# Patient Record
Sex: Male | Born: 1985 | Race: White | Hispanic: No | Marital: Married | Smoking: Current every day smoker
Health system: Southern US, Community
[De-identification: ages and names within clinical notes are randomized; demographics above are authoritative.]

## PROBLEM LIST (undated history)

## (undated) DIAGNOSIS — F32A Depression, unspecified: Secondary | ICD-10-CM

## (undated) DIAGNOSIS — F329 Major depressive disorder, single episode, unspecified: Secondary | ICD-10-CM

---
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
## 2007-09-23 ENCOUNTER — Emergency Department (HOSPITAL_COMMUNITY): Admission: EM | Admit: 2007-09-23 | Discharge: 2007-09-23 | Payer: Self-pay | Admitting: Emergency Medicine

## 2007-10-16 ENCOUNTER — Emergency Department (HOSPITAL_COMMUNITY): Admission: EM | Admit: 2007-10-16 | Discharge: 2007-10-17 | Payer: Self-pay | Admitting: Emergency Medicine

## 2008-05-13 ENCOUNTER — Emergency Department (HOSPITAL_COMMUNITY): Admission: EM | Admit: 2008-05-13 | Discharge: 2008-05-13 | Payer: Self-pay | Admitting: Emergency Medicine

## 2009-09-15 IMAGING — CT CT PELVIS W/O CM
2 of 4 series · 14 of 32 positions shown, 19 images · non-contrast
Comparison: None available

CT ABDOMEN

CLINICAL DATA: Left flank pain times 2-3 days.  History of renal
stones

CT ABDOMEN AND PELVIS WITHOUT CONTRAST
TECHNIQUE: Multidetector CT imaging of the abdomen and pelvis was
performed following the standard
protocol without intravenous contrast.

[Series 2: renal stone · axial · 0.70mm/px · z∈[-388,-58]mm · 7 of 90 slices shown, 12 images]
[im 12/90  soft-tissue]
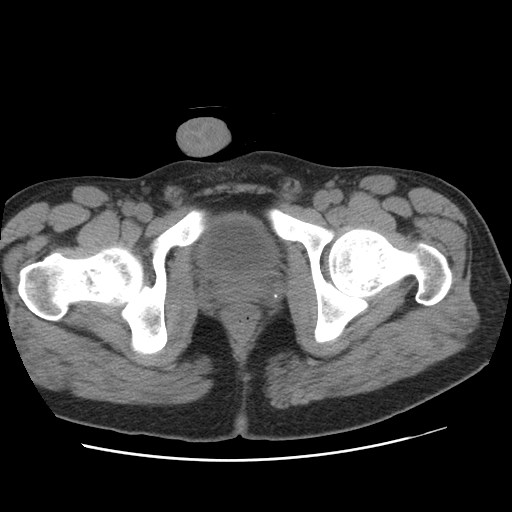
[im 12/90  bone]
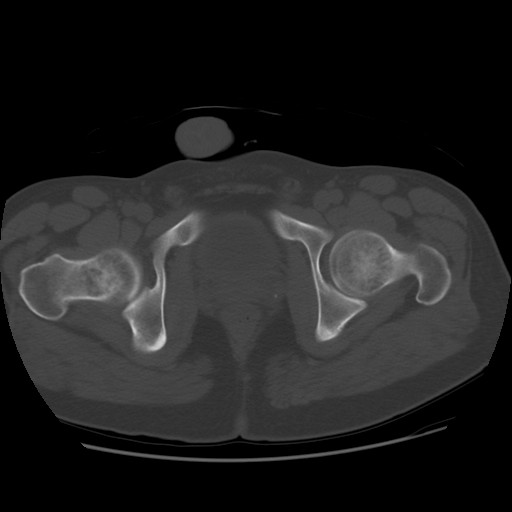
[im 23/90  soft-tissue]
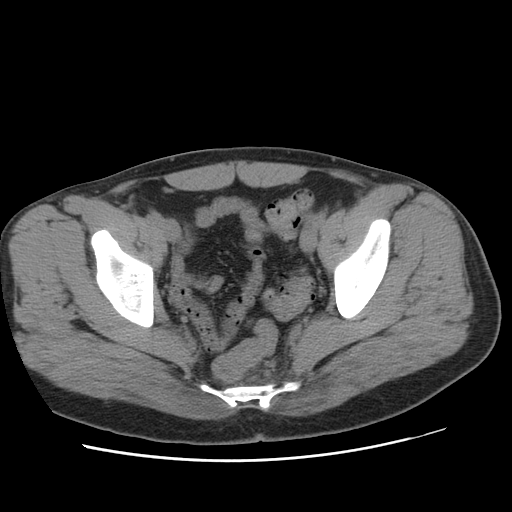
[im 34/90  soft-tissue]
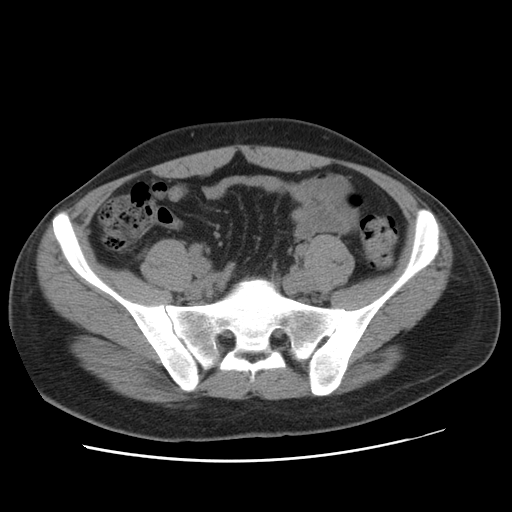
[im 45/90  soft-tissue]
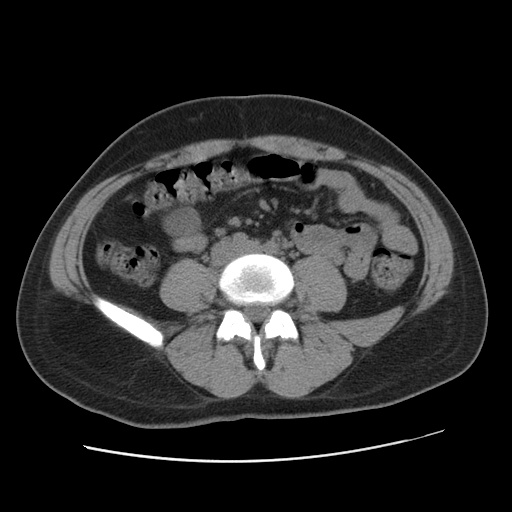
[im 45/90  lung]
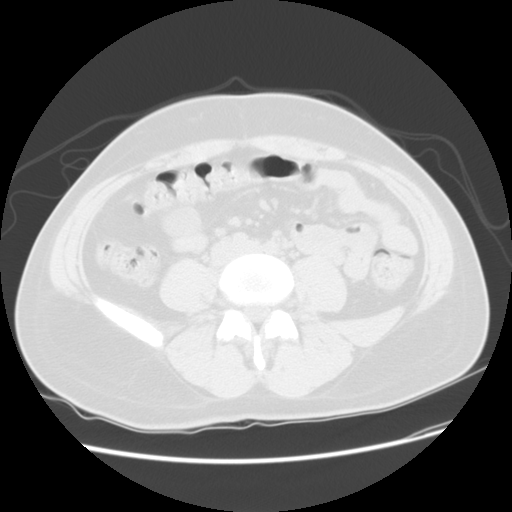
[im 56/90  soft-tissue]
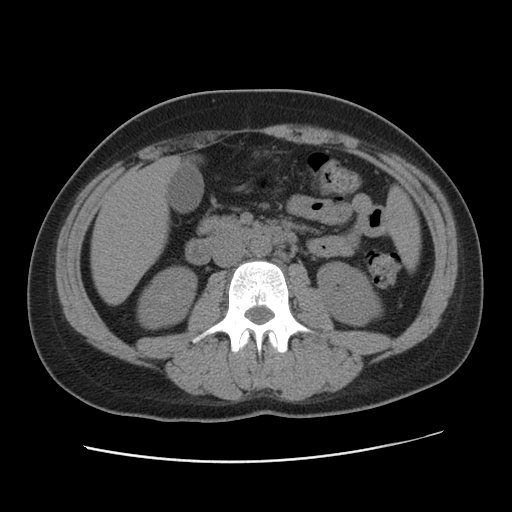
[im 56/90  lung]
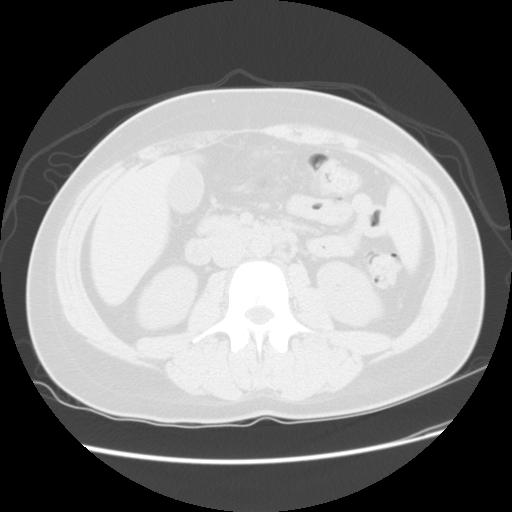
[im 67/90  soft-tissue]
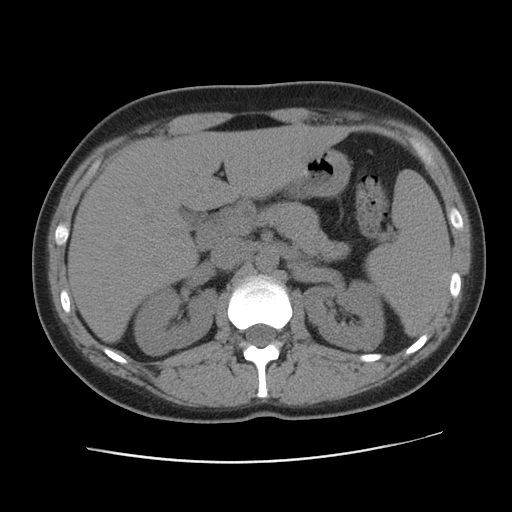
[im 67/90  lung]
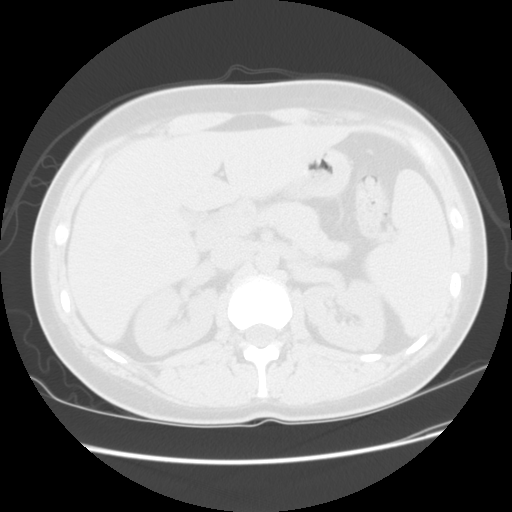
[im 78/90  soft-tissue]
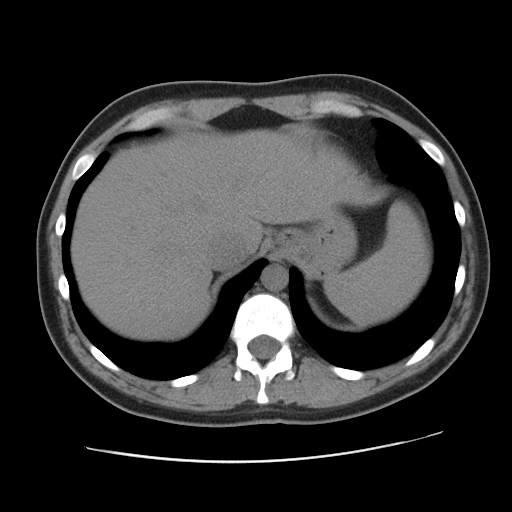
[im 78/90  lung]
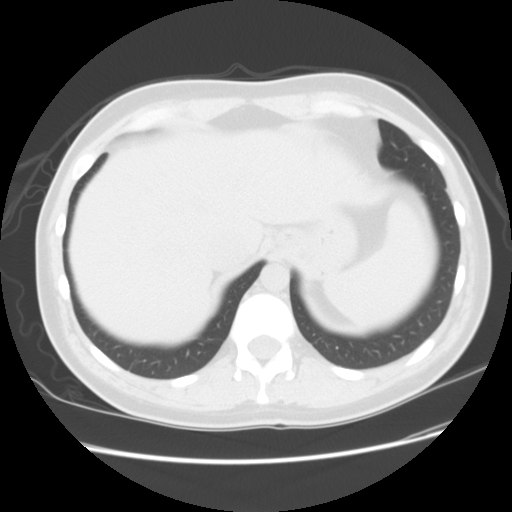

[Series 400: sag · sagittal · 0.89mm/px · 7 of 101 slices shown]
[im 11/101  soft-tissue]
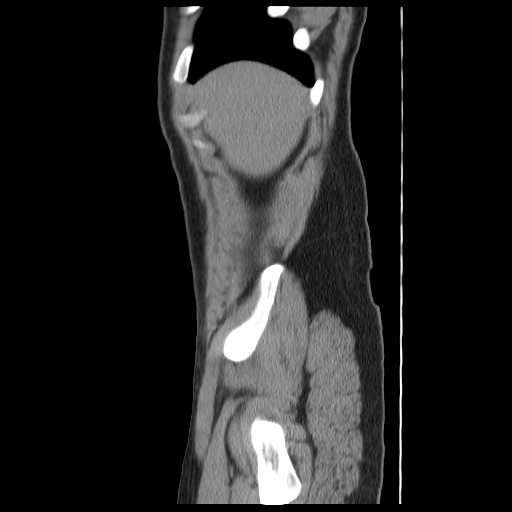
[im 21/101  soft-tissue]
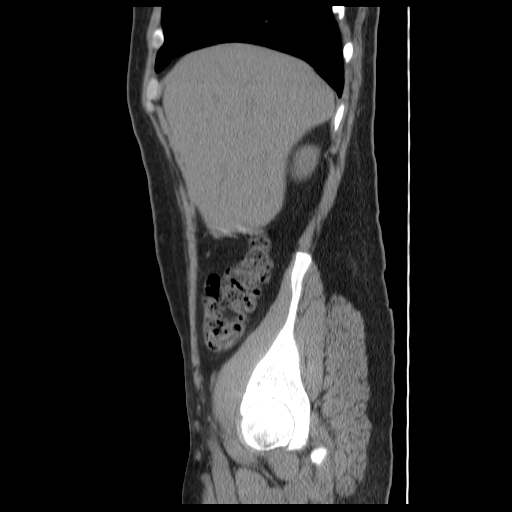
[im 31/101  soft-tissue]
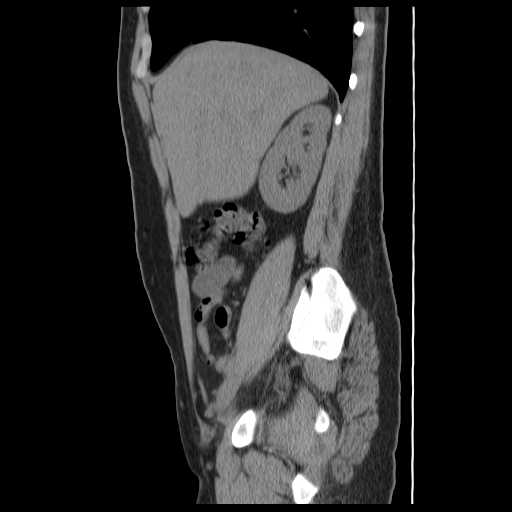
[im 41/101  soft-tissue]
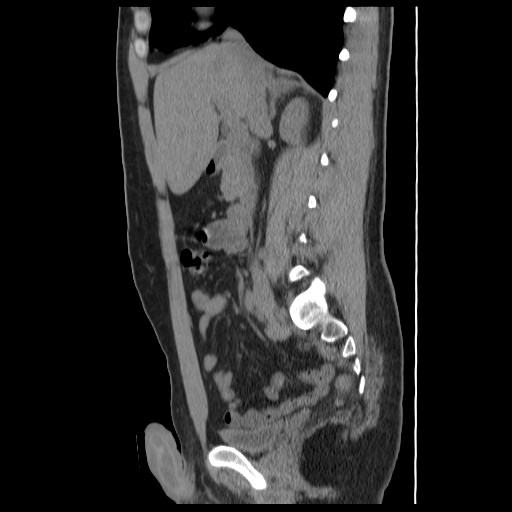
[im 61/101  soft-tissue]
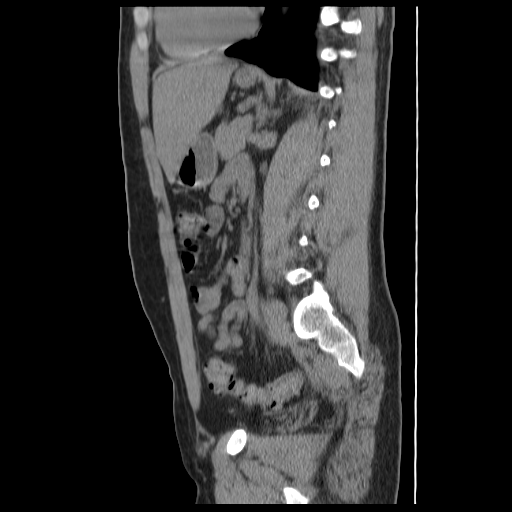
[im 71/101  soft-tissue]
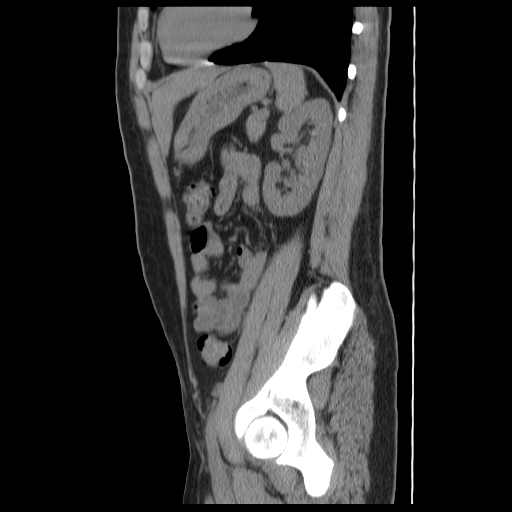
[im 81/101  soft-tissue]
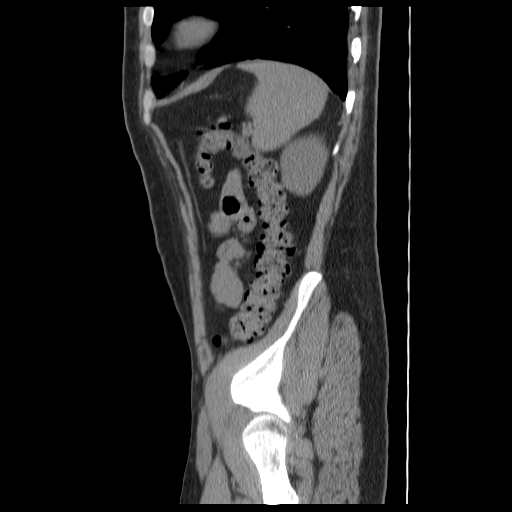

[14 of 32 positions shown; findings below may reference images not displayed]

FINDINGS: Lung bases are clear.  Heart base appears normal.

Non-IV contrast images demonstrate no focal hepatic lesion.  The
gallbladder, pancreas, spleen, and adrenal glands appear normal.
There is a small smudgy calcification in the upper pole of the left
kidney (image 21).  This is nonobstructive.  Similar  calcification
in upper pole of the right kidney.  No evidence of obstructive
uropathy, hydronephrosis or hydroureter.

The stomach, duodenum, appendix, and cecum appear normal.  Colon
appears normal.

Abdominal aorta is normal.  There is several of 4 to 7 mm left
periaortic lymph nodes which are not pathologic by CT size
criteria.  No periportal lymphadenopathy.
IMPRESSION: 1..  Ill defined smudgy calcifications in the upper pole of the
left to right kidney are nonobstructive.
2..  No evidence of obstructive uropathy or ureteral lithiasis.
3..  Several left periaortic lymph nodes are not pathologic by CT
size criteria. This is a nonspecific finding.

CT PELVIS
FINDINGS: No free fluid in the pelvis.  No bladder stones.  No
distal ureteral stones.

The rectum and sigmoid colon appear normal.

No evidence of pelvic lymphadenopathy

Review of  bone windows demonstrates no aggressive osseous lesions.
IMPRESSION: 1.  No acute pelvic process.

## 2010-09-30 ENCOUNTER — Emergency Department (HOSPITAL_BASED_OUTPATIENT_CLINIC_OR_DEPARTMENT_OTHER)
Admission: EM | Admit: 2010-09-30 | Discharge: 2010-09-30 | Disposition: A | Discharge status: Home / Self Care | Payer: Self-pay | Attending: Emergency Medicine | Admitting: Emergency Medicine

## 2010-09-30 ENCOUNTER — Encounter: Payer: Self-pay | Admitting: *Deleted

## 2010-09-30 DIAGNOSIS — K089 Disorder of teeth and supporting structures, unspecified: Secondary | ICD-10-CM | POA: Insufficient documentation

## 2010-09-30 DIAGNOSIS — K0889 Other specified disorders of teeth and supporting structures: Secondary | ICD-10-CM

## 2010-09-30 MED ORDER — PENICILLIN V POTASSIUM 500 MG PO TABS: 500.0000 mg | ORAL_TABLET | Freq: Four times a day (QID) | ORAL | Status: AC

## 2010-09-30 MED ORDER — PENICILLIN V POTASSIUM 500 MG PO TABS: 500.0000 mg | ORAL_TABLET | Freq: Four times a day (QID) | ORAL | Status: DC

## 2010-09-30 MED ORDER — HYDROCODONE-ACETAMINOPHEN 5-325 MG PO TABS
1.0000 | ORAL_TABLET | Freq: Once | ORAL | Status: AC
  Administered 2010-09-30: 1 via ORAL
  Filled 2010-09-30: qty 1

## 2010-09-30 MED ORDER — IBUPROFEN 600 MG PO TABS: 600.0000 mg | ORAL_TABLET | Freq: Four times a day (QID) | ORAL | Status: AC | PRN

## 2010-09-30 MED ORDER — HYDROCODONE-ACETAMINOPHEN 5-325 MG PO TABS: 2.0000 | ORAL_TABLET | ORAL | Status: AC | PRN

## 2010-09-30 NOTE — ED Provider Notes (Signed)
History    Scribed for Forbes Cellar, MD, the patient was seen in room MH06/MH06. This chart was scribed by Clarita Crane. This patient's care was started at 9:13PM.  CSN: [id number] Arrival date & time: 09/30/2010  8:35 PM  Chief Complaint  Patient presents with  . Dental Pain   HPI Patient is a male c/o dental pain to upper right sided molar with associated swelling onset last night after biting down on a chicken bone while eating he was however having pain before that time. Denies fever, chills, nausea, vomiting, HA, neck pain, difficulty hearing, numbness, weakness. Patient reports pain is aggravated with palpation and moving mouth and relieved by nothing. Notes he has an appointment scheduled with his dentist for 10/03/2010. Patient is a current everyday smoker. No other complaints. Upper molar previously fractured, had plans for repair but never followed up   PAST MEDICAL HISTORY:  History reviewed. No pertinent past medical history.  PAST SURGICAL HISTORY:  History reviewed. No pertinent past surgical history.  MEDICATIONS:  Previous Medications   No medications on file     ALLERGIES:  Allergies as of 09/30/2010  . (No Known Allergies)     FAMILY HISTORY:  No family history on file.   SOCIAL HISTORY: History   Social History  . Marital Status: Married    Spouse Name: N/A    Number of Children: N/A  . Years of Education: N/A   Social History Main Topics  . Smoking status: Current Everyday Smoker -- 1.0 packs/day  . Smokeless tobacco: None  . Alcohol Use: Yes  . Drug Use: No  . Sexually Active:    Other Topics Concern  . None   Social History Narrative  . None       Review of Systems 10 Systems reviewed and are negative for acute change except as noted in the HPI.  Physical Exam  BP 151/89  Pulse 61  Temp(Src) 98.5 F (36.9 C) (Oral)  Resp 17  Ht 5\' 11"  (1.803 m)  Wt 200 lb (90.719 kg)  BMI 27.89 kg/m2  SpO2 100%  Physical Exam  Nursing note  and vitals reviewed. Constitutional: He is oriented to person, place, and time. He appears well-developed and well-nourished. No distress.  HENT:  Head: Normocephalic and atraumatic.  Right Ear: External ear normal.  Left Ear: External ear normal.       Broken right upper molar with some tenderness to percussion. No erythema or redness of gums. No abscess to drain. No pulp/dentin exposed  Eyes: EOM are normal.  Neck: Normal range of motion. Neck supple.  Cardiovascular: Normal rate, regular rhythm and normal heart sounds.  Exam reveals no gallop and no friction rub.   No murmur heard.      Hypertensive  Pulmonary/Chest: Effort normal and breath sounds normal. He has no wheezes. He has no rales.  Abdominal: Soft. Bowel sounds are normal. He exhibits no distension. There is no tenderness.  Musculoskeletal: Normal range of motion. He exhibits no edema.  Neurological: He is alert and oriented to person, place, and time. No sensory deficit.  Skin: Skin is warm and dry.  Psychiatric: He has a normal mood and affect. His behavior is normal.    ED Course  Procedures  OTHER DATA REVIEWED: Nursing notes, vital signs, and past medical records reviewed.    ED COURSE / COORDINATION OF CARE: 9:16PM- Patient advised of intent to prescribe pain medication and oral abx for potential infection and instructed to keep scheduled appointment  with dentist set for 10/03/2010   MDM: Differential Diagnosis: Likely early abscess. I cannot appreciate a new fracture.     PLAN: Discharge The patient is to return the emergency department if there is any worsening of symptoms. I have reviewed the discharge instructions with the patient/family   CONDITION ON DISCHARGE: Stable   MEDICATIONS GIVEN IN THE E.D. Medications - No data to display   I personally performed the services described in this documentation, which was scribed in my presence. The recorded information has been reviewed and considered.  Forbes Cellar, MD    Forbes Cellar, MD 09/30/10 2125

## 2010-09-30 NOTE — ED Notes (Signed)
Pt presents to ED with c/o right upper molar tooth pain.  Pts gum reddned and painful.  Pt has poor dental care and no ability to follow up.

## 2010-09-30 NOTE — ED Notes (Signed)
Toothache right upper molar.

## 2013-06-30 ENCOUNTER — Ambulatory Visit (HOSPITAL_COMMUNITY)
Admission: RE | Admit: 2013-06-30 | Discharge: 2013-06-30 | Disposition: A | Discharge status: Home / Self Care | Payer: Self-pay | Attending: Psychiatry | Admitting: Psychiatry

## 2013-06-30 NOTE — BH Assessment (Signed)
Tele Assessment Note   Clifford Parsons is an 28 y.o. male that [facility]. Pt started using at age 28. He reports daily use for the past year. He uses up to 3 grams per day. His last use was today 1.5 grams.  Patient has the following withdrawal symptoms: cramps, cold/hot flashes, and agitation. He has never received substance abuse treatment in the past. He does not have any mental outpatient providers.Pt denies SI, HI, and AVH's. He denies depression and/or anxiety issues.   Patient does not meet criteria for detox. [facility] does not provide opioid detox. Writer referred patient to [facility] and [facility] for detox/rehab. Pt referred to [facility] for outpatient services.   Axis I: Opioid Dependence Axis II: Deferred Axis III: No past medical history on file. Axis IV: other psychosocial or environmental problems, problems related to social environment, problems with access to health care services and problems with primary support group Axis V: 45  Past Medical History: No past medical history on file.  No past surgical history on file.  Family History: No family history on file.  Social History:  reports that he has been smoking.  He does not have any smokeless tobacco history on file. He reports that he drinks alcohol. He reports that he does not use illicit drugs.  Additional Social History:  Alcohol / Drug Use Pain Medications: SEE MAR Prescriptions: SEE MAR Over the Counter: SEE MAR History of alcohol / drug use?: Yes Substance #1 Name of Substance 1: Heroin  1 - Age of First Use: 26 1 - Amount (size/oz): up to 3 grams 1 - Frequency: daily  1 - Duration: 1 yr 1 - Last Use / Amount: 1.5 gram  CIWA:   COWS:    Allergies: No Known Allergies  Home Medications:  (Not in a hospital admission)  OB/GYN Status:  No LMP for male patient.  General Assessment Data Location of Assessment: [facility] Assessment Services Is this a Tele or Face-to-Face Assessment?: Face-to-Face Is  this an Initial Assessment or a Re-assessment for this encounter?: Initial Assessment Living Arrangements: Alone Can pt return to current living arrangement?: Yes Admission Status: Voluntary Is patient capable of signing voluntary admission?: Yes Transfer from: Acute Hospital Referral Source: Self/Family/Friend     [facility] Living Arrangements: Alone Name of Psychiatrist:  (No psychiatrist ) Name of Therapist:  (No therapist )  Education Status Is patient currently in school?: No  Risk to self Suicidal Ideation: No Suicidal Intent: No Is patient at risk for suicide?: No Suicidal Plan?: No Access to Means: No What has been your use of drugs/alcohol within the last 12 months?:  (Heroin ) Previous Attempts/Gestures: No How many times?:  (0) Other Self Harm Risks:  (n/a) Triggers for Past Attempts: Other (Comment) (No previous attempts/gestures) Intentional Self Injurious Behavior: None Family Suicide History: No Recent stressful life event(s): Other (Comment) ("I need to detox off my Heroin") Persecutory voices/beliefs?: No Depression: Yes Depression Symptoms: Feeling angry/irritable;Feeling worthless/self pity;Loss of interest in usual pleasures;Guilt;Fatigue Substance abuse history and/or treatment for substance abuse?: No Suicide prevention information given to non-admitted patients: Not applicable  Risk to Others Homicidal Ideation: No Thoughts of Harm to Others: No Current Homicidal Intent: No Current Homicidal Plan: No Access to Homicidal Means: No Identified Victim:  (n/a) History of harm to others?: No Assessment of Violence: None Noted Violent Behavior Description:  (calm and cooperative ) Does patient have access to weapons?: No Criminal Charges Pending?: No Does patient  have a court date: No  Psychosis Hallucinations: None noted Delusions: None noted  Mental Status Report Appear/Hygiene: Disheveled Eye Contact: Good Motor Activity:  Freedom of movement Speech: Logical/coherent Level of Consciousness: Alert Mood: Depressed Affect: Appropriate to circumstance Anxiety Level: None Thought Processes: Coherent;Relevant Judgement: Unimpaired Orientation: Person;Place;Time;Situation;Appropriate for developmental age Obsessive Compulsive Thoughts/Behaviors: None  Cognitive Functioning Concentration: Decreased Memory: Recent Intact;Remote Intact IQ: Average Insight: Fair Impulse Control: Fair Appetite: Good Weight Loss:  (none reported ) Weight Gain:  (none reported ) Sleep: Decreased Total Hours of Sleep:  (varies ) Vegetative Symptoms: None  ADLScreening [facility] Assessment Services) Patient's cognitive ability adequate to safely complete daily activities?: Yes Patient able to express need for assistance with ADLs?: Yes Independently performs ADLs?: Yes (appropriate for developmental age)  Prior Inpatient Therapy Prior Inpatient Therapy: No Prior Therapy Dates:  (n/a) Prior Therapy Facilty/Provider(s):  (n/a) Reason for Treatment:  (n/a)  Prior Outpatient Therapy Prior Outpatient Therapy: No Prior Therapy Dates:  (n/a) Prior Therapy Facilty/Provider(s):  (n/a) Reason for Treatment:  (n/a)  ADL Screening (condition at time of admission) Patient's cognitive ability adequate to safely complete daily activities?: Yes Is the patient deaf or have difficulty hearing?: No Does the patient have difficulty seeing, even when wearing glasses/contacts?: No Does the patient have difficulty concentrating, remembering, or making decisions?: No Patient able to express need for assistance with ADLs?: Yes Does the patient have difficulty dressing or bathing?: No Independently performs ADLs?: Yes (appropriate for developmental age) Does the patient have difficulty walking or climbing stairs?: No Weakness of Legs: None Weakness of Arms/Hands: None  Home Assistive Devices/Equipment Home Assistive Devices/Equipment: None     Abuse/Neglect Assessment (Assessment to be complete while patient is alone) Physical Abuse: Denies Verbal Abuse: Denies Sexual Abuse: Denies Exploitation of patient/patient's resources: Denies Self-Neglect: Denies Values / Beliefs Cultural Requests During Hospitalization: None Spiritual Requests During Hospitalization: None   Advance Directives (For Healthcare) Advance Directive: Patient does not have advance directive Nutrition Screen- [facility] Adult/WL/AP Patient's home diet: Regular  Additional Information 1:1 In Past 12 Months?: No CIRT Risk: No Elopement Risk: No Does patient have medical clearance?: Yes     Disposition:  Disposition Initial Assessment Completed for this Encounter: Yes Disposition of Patient: Other dispositions (Referred to [facility], [facility], & [facility]) Other disposition(s): Referred to outside facility  [facility] Mona Ladana Chavero 06/30/2013 5:50 PM

## 2013-08-04 ENCOUNTER — Emergency Department (HOSPITAL_COMMUNITY)
Admission: EM | Admit: 2013-08-04 | Discharge: 2013-08-05 | Disposition: A | Discharge status: Home / Self Care | Payer: Self-pay | Attending: Emergency Medicine | Admitting: Emergency Medicine

## 2013-08-04 ENCOUNTER — Encounter (HOSPITAL_COMMUNITY): Payer: Self-pay | Admitting: Emergency Medicine

## 2013-08-04 DIAGNOSIS — F1120 Opioid dependence, uncomplicated: Secondary | ICD-10-CM | POA: Insufficient documentation

## 2013-08-04 DIAGNOSIS — F17200 Nicotine dependence, unspecified, uncomplicated: Secondary | ICD-10-CM | POA: Insufficient documentation

## 2013-08-04 DIAGNOSIS — F1110 Opioid abuse, uncomplicated: Secondary | ICD-10-CM

## 2013-08-04 DIAGNOSIS — F411 Generalized anxiety disorder: Secondary | ICD-10-CM | POA: Insufficient documentation

## 2013-08-04 HISTORY — DX: Major depressive disorder, single episode, unspecified: F32.9

## 2013-08-04 HISTORY — DX: Depression, unspecified: F32.A

## 2013-08-04 LAB — RAPID URINE DRUG SCREEN, HOSP PERFORMED
AMPHETAMINES: NOT DETECTED
BARBITURATES: NOT DETECTED
Benzodiazepines: NOT DETECTED
COCAINE: NOT DETECTED
Opiates: POSITIVE — AB
TETRAHYDROCANNABINOL: NOT DETECTED

## 2013-08-04 LAB — CBC
HCT: 43 % (ref 39.0–52.0)
HEMOGLOBIN: 15.3 g/dL (ref 13.0–17.0)
MCH: 29.8 pg (ref 26.0–34.0)
MCHC: 35.6 g/dL (ref 30.0–36.0)
MCV: 83.8 fL (ref 78.0–100.0)
PLATELETS: 210 10*3/uL (ref 150–400)
RBC: 5.13 MIL/uL (ref 4.22–5.81)
RDW: 12.2 % (ref 11.5–15.5)
WBC: 6 10*3/uL (ref 4.0–10.5)

## 2013-08-04 LAB — COMPREHENSIVE METABOLIC PANEL
ALK PHOS: 88 U/L (ref 39–117)
ALT: 15 U/L (ref 0–53)
AST: 22 U/L (ref 0–37)
Albumin: 4.6 g/dL (ref 3.5–5.2)
BUN: 9 mg/dL (ref 6–23)
CO2: 26 meq/L (ref 19–32)
Calcium: 10 mg/dL (ref 8.4–10.5)
Chloride: 101 mEq/L (ref 96–112)
Creatinine, Ser: 0.76 mg/dL (ref 0.50–1.35)
GFR calc non Af Amer: >90 mL/min (ref >90)
GLUCOSE: 100 mg/dL — AB (ref 70–99)
POTASSIUM: 4.7 meq/L (ref 3.7–5.3)
SODIUM: 143 meq/L (ref 137–147)
TOTAL PROTEIN: 8.5 g/dL — AB (ref 6.0–8.3)
Total Bilirubin: 0.7 mg/dL (ref 0.3–1.2)

## 2013-08-04 LAB — SALICYLATE LEVEL: Salicylate Lvl: <2 mg/dL — ABNORMAL LOW (ref 2.8–20.0)

## 2013-08-04 LAB — ETHANOL: Alcohol, Ethyl (B): <11 mg/dL (ref 0–11)

## 2013-08-04 LAB — ACETAMINOPHEN LEVEL

## 2013-08-04 MED ORDER — DICYCLOMINE HCL 20 MG PO TABS
20.0000 mg | ORAL_TABLET | Freq: Four times a day (QID) | ORAL | Status: DC | PRN
  Administered 2013-08-05 (×2): 20 mg via ORAL
  Filled 2013-08-04 (×2): qty 1

## 2013-08-04 MED ORDER — CLONIDINE HCL 0.1 MG PO TABS: 0.1000 mg | ORAL_TABLET | ORAL | Status: DC

## 2013-08-04 MED ORDER — METHOCARBAMOL 500 MG PO TABS
500.0000 mg | ORAL_TABLET | Freq: Three times a day (TID) | ORAL | Status: DC | PRN
  Administered 2013-08-05 (×2): 500 mg via ORAL
  Filled 2013-08-04 (×2): qty 1

## 2013-08-04 MED ORDER — CLONIDINE HCL 0.1 MG PO TABS
0.1000 mg | ORAL_TABLET | Freq: Four times a day (QID) | ORAL | Status: DC
  Administered 2013-08-05: 0.1 mg via ORAL
  Filled 2013-08-04: qty 1

## 2013-08-04 MED ORDER — ACETAMINOPHEN 325 MG PO TABS: 650.0000 mg | ORAL_TABLET | ORAL | Status: DC | PRN

## 2013-08-04 MED ORDER — HYDROXYZINE HCL 25 MG PO TABS
25.0000 mg | ORAL_TABLET | Freq: Four times a day (QID) | ORAL | Status: DC | PRN
  Administered 2013-08-05 (×2): 25 mg via ORAL
  Filled 2013-08-04 (×2): qty 1

## 2013-08-04 MED ORDER — ONDANSETRON HCL 4 MG PO TABS
4.0000 mg | ORAL_TABLET | Freq: Three times a day (TID) | ORAL | Status: DC | PRN
  Administered 2013-08-05: 4 mg via ORAL
  Filled 2013-08-04: qty 1

## 2013-08-04 MED ORDER — ZOLPIDEM TARTRATE 5 MG PO TABS: 5.0000 mg | ORAL_TABLET | Freq: Every evening | ORAL | Status: DC | PRN

## 2013-08-04 MED ORDER — NAPROXEN 250 MG PO TABS
500.0000 mg | ORAL_TABLET | Freq: Two times a day (BID) | ORAL | Status: DC | PRN
  Administered 2013-08-05 (×2): 500 mg via ORAL
  Filled 2013-08-04 (×2): qty 2

## 2013-08-04 MED ORDER — NICOTINE 21 MG/24HR TD PT24
21.0000 mg | MEDICATED_PATCH | Freq: Every day | TRANSDERMAL | Status: DC
  Administered 2013-08-05: 21 mg via TRANSDERMAL
  Filled 2013-08-04: qty 1

## 2013-08-04 MED ORDER — LORAZEPAM 1 MG PO TABS
1.0000 mg | ORAL_TABLET | Freq: Three times a day (TID) | ORAL | Status: DC | PRN
Start: 2013-08-04 — End: 2013-08-05
  Administered 2013-08-05 (×2): 1 mg via ORAL
  Filled 2013-08-04 (×2): qty 1

## 2013-08-04 MED ORDER — ALUM & MAG HYDROXIDE-SIMETH 200-200-20 MG/5ML PO SUSP: 30.0000 mL | ORAL | Status: DC | PRN

## 2013-08-04 MED ORDER — LOPERAMIDE HCL 2 MG PO CAPS: 2.0000 mg | ORAL_CAPSULE | ORAL | Status: DC | PRN

## 2013-08-04 MED ORDER — CLONIDINE HCL 0.1 MG PO TABS: 0.1000 mg | ORAL_TABLET | Freq: Every day | ORAL | Status: DC

## 2013-08-04 NOTE — ED Notes (Signed)
The pt wants to be detoxed from heroin .  His last use was yesterday am.  Agitated unable to sit still.  He last was detoxed in jan 2015

## 2013-08-04 NOTE — ED Provider Notes (Signed)
CSN: [id number]     Arrival date & time 08/04/13  1853 History   First MD Initiated Contact with Patient 08/04/13 2329     Chief Complaint  Patient presents with  . Drug Problem     (Consider location/radiation/quality/duration/timing/severity/associated sxs/prior Treatment) Patient is a 28 y.o. male presenting with drug problem. The history is provided by the patient.  Drug Problem  He states that he is going through heroin withdrawal and wants help with detox. Last her in which case with his history morning. He does use it intravenously. He is complaining of being very anxious and total body aches which he rates at 10/10. There is no nausea, vomiting, diarrhea. He states he has gone through detox before and is asking if we're going to start him on Suboxone. He denies other drug use.  History reviewed. No pertinent past medical history. History reviewed. No pertinent past surgical history. History reviewed. No pertinent family history. History  Substance Use Topics  . Smoking status: Current Every Day Smoker -- 1.00 packs/day  . Smokeless tobacco: Not on file  . Alcohol Use: Yes    Review of Systems  All other systems reviewed and are negative.     Allergies  Review of patient's allergies indicates no known allergies.  Home Medications   Prior to Admission medications   Not on File   BP 126/79  Pulse 50  Temp(Src) 98.2 F (36.8 C) (Oral)  Resp 18  Ht 5\' 9"  (1.753 m)  Wt 170 lb (77.111 kg)  BMI 25.09 kg/m2  SpO2 99% Physical Exam  Nursing note and vitals reviewed.  28 year old male, resting comfortably and in no acute distress. Vital signs are normal. Oxygen saturation is 98%, which is normal. Head is normocephalic and atraumatic. PERRLA, EOMI. Oropharynx is clear. Neck is nontender and supple without adenopathy or JVD. Back is nontender and there is no CVA tenderness. Lungs are clear without rales, wheezes, or rhonchi. Chest is nontender. Heart has regular  rate and rhythm without murmur. Abdomen is soft, flat, nontender without masses or hepatosplenomegaly and peristalsis is normoactive. Extremities have no cyanosis or edema, full range of motion is present. Skin is warm and dry without rash. Neurologic: Mental status is normal, cranial nerves are intact, there are no motor or sensory deficits.  ED Course  Procedures (including critical care time) Labs Review Results for orders placed during the hospital encounter of 08/04/13  ACETAMINOPHEN LEVEL      Result Value Ref Range   Acetaminophen (Tylenol), Serum <15.0  10 - 30 ug/mL  CBC      Result Value Ref Range   WBC 6.0  4.0 - 10.5 K/uL   RBC 5.13  4.22 - 5.81 MIL/uL   Hemoglobin 15.3  13.0 - 17.0 g/dL   HCT 97.5  88.3 - 25.4 %   MCV 83.8  78.0 - 100.0 fL   MCH 29.8  26.0 - 34.0 pg   MCHC 35.6  30.0 - 36.0 g/dL   RDW 98.2  64.1 - 58.3 %   Platelets 210  150 - 400 K/uL  COMPREHENSIVE METABOLIC PANEL      Result Value Ref Range   Sodium 143  137 - 147 mEq/L   Potassium 4.7  3.7 - 5.3 mEq/L   Chloride 101  96 - 112 mEq/L   CO2 26  19 - 32 mEq/L   Glucose, Bld 100 (*) 70 - 99 mg/dL   BUN 9  6 - 23 mg/dL  Creatinine, Ser 0.76  0.50 - 1.35 mg/dL   Calcium 60.410.0  8.4 - 54.010.5 mg/dL   Total Protein 8.5 (*) 6.0 - 8.3 g/dL   Albumin 4.6  3.5 - 5.2 g/dL   AST 22  0 - 37 U/L   ALT 15  0 - 53 U/L   Alkaline Phosphatase 88  39 - 117 U/L   Total Bilirubin 0.7  0.3 - 1.2 mg/dL   GFR calc non Af Amer >90  >90 mL/min   GFR calc Af Amer >90  >90 mL/min  ETHANOL      Result Value Ref Range   Alcohol, Ethyl (B) <11  0 - 11 mg/dL  SALICYLATE LEVEL      Result Value Ref Range   Salicylate Lvl <2.0 (*) 2.8 - 20.0 mg/dL  URINE RAPID DRUG SCREEN (HOSP PERFORMED)      Result Value Ref Range   Opiates POSITIVE (*) NONE DETECTED   Cocaine NONE DETECTED  NONE DETECTED   Benzodiazepines NONE DETECTED  NONE DETECTED   Amphetamines NONE DETECTED  NONE DETECTED   Tetrahydrocannabinol NONE DETECTED   NONE DETECTED   Barbiturates NONE DETECTED  NONE DETECTED    MDM   Final diagnoses:  Heroin abuse    Heroin withdrawal. Old records are reviewed and he had an outpatient evaluation for detox one month ago. When asked about this, patient denied that he had an evaluation. He was referred to [facility], [facility], and [facility]. He is started on clonidine and consultation will be obtained with [facility].    Dione Boozeavid Davian Wollenberg, MD 08/05/13 [phone]

## 2013-08-05 ENCOUNTER — Encounter (HOSPITAL_COMMUNITY): Payer: Self-pay | Admitting: *Deleted

## 2013-08-05 NOTE — ED Notes (Signed)
Spoke with pt regarding detox and treatment from heroin-- states has spent $23,000 in past month (from an insurance settlement) on heroin. [facility], never been through treatment. Has 2 children, ages 2 and 3, in custody of DSS. Wife is also a pt here-- for detox.

## 2013-08-05 NOTE — ED Notes (Signed)
[facility]

## 2013-08-05 NOTE — ED Notes (Signed)
Pt asked if he could walk in hallway, perimeters given, pt was returned to unit with 2 nursing assistants-- stated "I just wanted to go outside to smoke a cigarette".

## 2013-08-05 NOTE — ED Notes (Signed)
Spoke with [facility] and [facility]-- pt has a court date on Monday,  [facility], pt cannot return to those at this time. [facility] feels that pt should already be detoxed since last use was 07/01/13. [facility] -- No. Spoke with Jessie Footoyka, to review options, she will talk with Dr. Anitra LauthPlunkett

## 2013-08-05 NOTE — BH Assessment (Signed)
Tele Assessment Note   Clifford Parsons is a 28 y.o. male who [facility] for [facility] Det[facility](Heroin).  Pt denies SI/HI/AVH.  Pt repots the following: Pt uses 2 grams of heroin, daily.  Pt.'s last use was 08/01/13, he used 1/2 gram. Pt injects in bilateral arms.  Pt c/o w/d sxs: diarrhea, stomach and body aches, anxiety, irritability, hot/cold sweats and insomnia.  Pt denies legal issues at this time, no seizures/blackouts.  Pt reports inpt admission with [facility] in 2015 for detox treatment.     Axis I: Opioid Use D/O, Severe  Axis II: Deferred Axis III:  Past Medical History  Diagnosis Date  . Depression    Axis IV: other psychosocial or environmental problems, problems related to social environment and problems with primary support group Axis V: 41-50 serious symptoms  Past Medical History:  Past Medical History  Diagnosis Date  . Depression     History reviewed. No pertinent past surgical history.  Family History: History reviewed. No pertinent family history.  Social History:  reports that he has been smoking.  He does not have any smokeless tobacco history on file. He reports that he uses illicit drugs (Heroin). He reports that he does not drink alcohol.  Additional Social History:  Alcohol / Drug Use Pain Medications: None  Prescriptions: None  Over the Counter: None  History of alcohol / drug use?: Yes Longest period of sobriety (when/how long): During detox program  Negative Consequences of Use: Work / School;Personal relationships;Financial Withdrawal Symptoms: Diarrhea;Fever / Chills;Irritability;Sweats;Tremors;Other (Comment) (stomach and body aches ) Substance #1 Name of Substance 1: Heroin  1 - Age of First Use: [age] YOM  1 - Amount (size/oz): 2 Grams  1 - Frequency: Daily  1 - Duration: On-going  1 - Last Use / Amount: 08/01/13  CIWA: CIWA-Ar BP: 96/65 mmHg Pulse Rate: 48 COWS: Clinical Opiate Withdrawal Scale (COWS) Resting Pulse Rate: Pulse Rate 80 or  below Sweating: No report of chills or flushing Restlessness: Able to sit still Pupil Size: Pupils pinned or normal size for room light Bone or Joint Aches: Patient reports sever diffuse aching of joints/muscles Runny Nose or Tearing: Not present GI Upset: nausea or loose stool Tremor: Tremor can be felt, but not observed Yawning: Yawning once or twice during assessment Anxiety or Irritability: Patient reports increasing irritability or anxiousness Gooseflesh Skin: Skin is smooth COWS Total Score: 7  Allergies: No Known Allergies  Home Medications:  (Not in a hospital admission)  OB/GYN Status:  No LMP for male patient.  General Assessment Data [facility] Is this a Tele or Face-to-Face Assessment?: Tele Assessment Is this an Initial Assessment or a Re-assessment for this encounter?: Initial Assessment Living Arrangements: Alone Can pt return to current living arrangement?: No Admission Status: Voluntary Is patient capable of signing voluntary admission?: Yes Transfer from: Acute Hospital Referral Source: MD  Medical Screening Exam [facility] Walk-in ONLY) Medical Exam completed: No Reason for MSE not completed: Other:  [facility] Living Arrangements: Alone Name of Psychiatrist: None  Name of Therapist: None   Education Status Is patient currently in school?: No Current Grade: None  Highest grade of school patient has completed: None  Name of school: None  Contact person: None   Risk to self Suicidal Ideation: No Suicidal Intent: No Is patient at risk for suicide?: No Suicidal Plan?: No Access to Means: No What has been your use of drugs/alcohol within the last 12 months?: Abusing: Heroin  Previous  Attempts/Gestures: No How many times?: 0 Other Self Harm Risks: None  Triggers for Past Attempts: None known Intentional Self Injurious Behavior: None Family Suicide History: No Recent stressful life event(s): Other (Comment)  (Chronicity--SA) Persecutory voices/beliefs?: No Depression: Yes Depression Symptoms: Loss of interest in usual pleasures Substance abuse history and/or treatment for substance abuse?: Yes Suicide prevention information given to non-admitted patients: Not applicable  Risk to Others Homicidal Ideation: No Thoughts of Harm to Others: No Current Homicidal Intent: No Current Homicidal Plan: No Access to Homicidal Means: No Identified Victim: None  History of harm to others?: No Assessment of Violence: None Noted Violent Behavior Description: Nnoe  Does patient have access to weapons?: No Criminal Charges Pending?: No Does patient have a court date: No  Psychosis Hallucinations: None noted Delusions: None noted  Mental Status Report Appear/Hygiene: Disheveled;In scrubs Eye Contact: Poor Motor Activity: Agitation Speech: Logical/coherent Level of Consciousness: Alert Mood: Irritable;Anxious Affect: Irritable;Sad Anxiety Level: None Thought Processes: Coherent;Relevant Judgement: Unimpaired Orientation: Person;Place;Time;Situation Obsessive Compulsive Thoughts/Behaviors: None  Cognitive Functioning Concentration: Decreased Memory: Recent Intact;Remote Intact IQ: Average Insight: Fair Impulse Control: Fair Appetite: Poor Weight Loss: 0 Weight Gain: 0 Sleep: Decreased Total Hours of Sleep: 4 Vegetative Symptoms: None  ADLScreening [facility] Assessment Services) Patient's cognitive ability adequate to safely complete daily activities?: Yes Patient able to express need for assistance with ADLs?: Yes Independently performs ADLs?: Yes (appropriate for developmental age)  Prior Inpatient Therapy Prior Inpatient Therapy: Yes Prior Therapy Dates: 2015 Prior Therapy Facilty/Provider(s): [facility]  Reason for Treatment: Detox   Prior Outpatient Therapy Prior Outpatient Therapy: No Prior Therapy Dates: None  Prior Therapy Facilty/Provider(s): None  Reason for Treatment: None    ADL Screening (condition at time of admission) Patient's cognitive ability adequate to safely complete daily activities?: Yes Is the patient deaf or have difficulty hearing?: No Does the patient have difficulty seeing, even when wearing glasses/contacts?: No Does the patient have difficulty concentrating, remembering, or making decisions?: Yes Patient able to express need for assistance with ADLs?: Yes Does the patient have difficulty dressing or bathing?: No Independently performs ADLs?: Yes (appropriate for developmental age) Does the patient have difficulty walking or climbing stairs?: No Weakness of Legs: None Weakness of Arms/Hands: None  Home Assistive Devices/Equipment Home Assistive Devices/Equipment: None  Therapy Consults (therapy consults require a physician order) PT Evaluation Needed: No OT Evalulation Needed: No SLP Evaluation Needed: No Abuse/Neglect Assessment (Assessment to be complete while patient is alone) Physical Abuse: Denies Verbal Abuse: Denies Sexual Abuse: Denies Exploitation of patient/patient's resources: Denies Self-Neglect: Denies Values / Beliefs Cultural Requests During Hospitalization: None Spiritual Requests During Hospitalization: None Consults Spiritual Care Consult Needed: No Social Work Consult Needed: No Merchant navy officerAdvance Directives (For Healthcare) Advance Directive: Patient does not have advance directive;Patient would not like information Pre-existing out of facility DNR order (yellow form or pink MOST form): No Nutrition Screen- [facility] Adult/WL/AP Patient's home diet: Regular  Additional Information 1:1 In Past 12 Months?: No CIRT Risk: No Elopement Risk: No Does patient have medical clearance?: Yes     Disposition:  Disposition Initial Assessment Completed for this Encounter: Yes Disposition of Patient: Referred to (Needs referral to [facility] or oupt referrals can be provided) Patient referred to: [facility];Other (Comment) (Outpt  referrals can be provided )  Beatrix ShipperSimmons, Lex Linhares C 08/05/2013 6:00 AM

## 2013-08-05 NOTE — Discharge Instructions (Signed)
Heroin Abuse and Withdrawal Heroin is an illegal opiate (narcotic) drug. It is very addictive. Once the effects of the drug are felt, the need to use the drug again (known as craving) is strong. When the drug is suddenly stopped after using it on a regular basis, significant and painful physical withdrawal symptoms are experienced.  EFFECTS OF HEROIN USE AND ABUSE The drug produces a profound feeling of pleasure and relaxation (euphoria) that lasts for a short time. This is followed by sleepiness and then agitation with craving for more drugs. After a short time of regular use (days or weeks), dependence on the drug is developed. This means the user will become ill without it (withdrawal) and needs to keep using the drug to function. This is how fast heroin abuse becomes physical dependence and addiction (chemical dependency). EFFECTS ON THE BRAIN Heroin is addictive because it activates regions of the brain that are responsible for producing both the pleasurable sensation of "reward" and physical dependence. Together, these actions account for the user's loss of control and the rapid development of drug dependence. HOW IT IS USED  Heroin is a drug that is mostly taken by a shot with a needle (injection) in the vein. Using a needle can have harmful effects on the user. Often times, the needle is unclean (unsterile), the heroin is contaminated with cutting agents, or heroin is used in combination with other drugs such as alcohol or cocaine.  In recent years, the purity of street heroin has increased a lot allowing users to "snort" the drug into their noses where it is absorbed through the lining of the nasal passages. You can become addicted this way. Many IV heroin users report they started by snorting. RELATED PROBLEMS  Needle sharing can cause AIDS. The AIDS virus is carried in contaminated blood left in the needle, syringe or other drug-related equipment. When unclean needles are used, the AIDS virus  is injected into the new user. There is no cure or proven vaccine for AIDS to prevent it.  Heroin use during pregnancy is associated with stillbirths. It can also increase the chance for miscarriage. Babies born addicted to heroin must undergo withdrawal after birth. These babies show a number of developmental problems.  Heroin use can cause serious health problems such as liver infection (hepatitis), skin abscesses, vein inflammation and heart disease. SYMPTOMS The signs and symptoms of heroin use include:  Euphoria.  Drowsiness.  Respiratory depression (which can progress until breathing stops).  Small (constricted) pupils and nausea.  Need for increasingly higher doses of the drug to get the same effect.  Weight loss.  "Track" marks (scars) on arms and legs.  Emotional instability. Symptoms of a heroin overdose include:   Shallow breathing.  Pinpoint pupils.  Clammy skin.  Convulsion.  Coma. WITHDRAWAL EFFECTS Withdrawal symptoms include:  Watery eyes.  Runny nose.  Yawning.  Loss of appetite.  Tremors.  Panic.  Chills.  Sweating.  Nausea.  Muscle cramps.  Not being able to sleep well (insomnia).  Depression and mood swings.  Elevations in blood pressure, pulse, respiratory rate and temperature. RISK OF OVERDOSE Because the drug is produced illegally, heroin users risk taking an unusually potent dose (due to differences in purity). This can lead to overdose, coma and possible death. Especially dangerous is the situation where a user has been "clean" (abstinent) from using the drug for a period of time and then relapses. Fatal overdoses can occur because the user fails to account for the change in  the body's tolerance to the drug. TREATMENT  There are many treatment options for heroin addiction. They include medications as well as behavioral therapies. Research has demonstrated that many people can successfully stop using heroin when medication is  combined with other supportive services. Patients can return to stable and productive lives, living completely free of all narcotics. Some of the medications and other treatment approaches used are:  Methadone. This is a medication that blocks the effects of heroin for about 24 hours. It has a proven record of success when prescribed at a high enough dosage level for people addicted to heroin.  Naloxone may be used to treat cases of overdose, as well as naltrexone. Both of these block the effects of morphine, heroin and other opiates. People who have become free of narcotics often take naltrexone to avoid relapse.  Buprenorphine is now available for treating addiction to heroin and other opiates. This medication offers less risk of addiction. It can be dispensed in the privacy of a doctor's office.  Several other medications for use in heroin treatment programs are also under study.  There are many effective behavioral treatments available for heroin addiction. These can include residential and outpatient approaches. 12-step programs, such as Narcotics Anonymous, have also proven to be effective. HOME CARE INSTRUCTIONS  Some individuals can successfully stop using heroin at home with medically assisted detoxification. This requires following your caregiver's instructions very carefully. This may include use of some of the following medications and therapies:  Stomach medicine can be used for the nausea.  Imodium can be used for the diarrhea.  Benadryl can be used for the sleeplessness and restlessness.  Anxiety medication such as Xanax or Ativan. These must be administered exactly as prescribed, usually with the help of a home caregiver.  Avoiding dehydration by drinking more fluids than usual. While water alone is fine, any non-alcoholic fluid is okay (soup, juice, etc.).  Providing quiet, calm support and cooling or warmth as needed.  Call your local emergency services (911 in [place] if  seizures occur or if the patient is unable to keep liquids down.  Keep a written record of medications given and times given. Addiction cannot be cured but it can be treated successfully.   Treatment centers are listed in the yellow pages under:  Substance Abuse.  Narcotics Anonymous.  Drug Abuse Counseling.  Most hospitals and clinics can refer you to a specialized care center.  The [place] government maintains a toll-free number for obtaining treatment referrals:1-213-318-4514 or 770-847-49561-614-521-5256 (TDD) and maintains a website: http://findtreatment.RockToxic.plsamhsa.gov.  Other websites for additional information are:  www.mentalhealth.RockToxic.plsamhsa.gov.  GreatestFeeling.tnwww.nida.gov.  In [place] treatment resources are listed in each [place] with listings available under:  OncologistThe [facility] Services or similar titles. Document Released: 02/13/2003 Document Revised: 05/05/2011 Document Reviewed: 01/28/2008 [facility] Patient Information 2014 [place]Care, [place].  Substance Abuse Your exam indicates that you have a problem with substance abuse. Substance abuse is the misuse of alcohol or drugs that causes problems in family life, friendships, and work relationships. Substance abuse is the most important cause of premature illness, disability, and death in our society. It is also the greatest threat to a person's mental and spiritual well being. Substance abuse can start out in an innocent way, such as social drinking or taking a little extra medication prescribed by your doctor. No one starts out with the intention of becoming an alcoholic or an addict. Substance abuse victims cannot control their use of alcohol or drugs. They may become intoxicated  daily or go on weekend binges. Often there is a strong desire to quit, but attempts to stop using often fail. Encounters with law enforcement or conflicts with family members, friends, and work associates are signs of a potential problem. Recovery is always possible, although the  craving for some drugs makes it difficult to quit without assistance. Many treatment programs are available to help people stop abusing alcohol or drugs. The first step in treatment is to admit you have a problem. This is a major hurdle because denial is a powerful force with substance abuse. Alcoholics Anonymous, Narcotics Anonymous, Cocaine Anonymous, and other recovery groups and programs can be very useful in helping people to quit. If you do not feel okay about your drug or alcohol use and if it is causing you trouble, we want to encourage you to talk about it with your doctor or with someone from a recovery group who can help you. You could also call the [employer] at 1-800-662-HELP. It is up to you to take the first step. AL-ANON and ALA-TEEN are support groups for friends and family members of an alcohol or drug dependent person. The people who love and care for the alcoholic or addicted person often need help, too. For information about these organizations, check your phone directory or call a local alcohol or drug treatment center. Document Released: 03/20/2004 Document Revised: 05/05/2011 Document Reviewed: 02/12/2008 [facility] Patient Information 2014 [place]Care, [place].

## 2013-08-05 NOTE — BH Assessment (Addendum)
[facility]. Writer spoke to [place] who requested patient's assessment. Sts that once assessment is received and reviewed he will call back. Referral faxed.

## 2013-08-05 NOTE — BH Assessment (Addendum)
Patient's last use of substances was 08/01/2013. No criteria for inpatient detox. [facility] to schedule an appointment. Writer awaiting a call back from [facility] staff Jeff who will confirm appointment. Patient will more than likely be discharged home today with [facility]. He also has a court date 08/08/2013.

## 2013-08-05 NOTE — BH Assessment (Addendum)
Spoke to EDP-Dr. Anitra LauthPlunkett regarding patient's disposition. Sts that [facility] will be [facility]. [facility]Jeff [facility] Patient will be made aware that [facility] has all information and will be expecting his call.  Contact: [facility] Address: [address], [place], [place] [address] Phone: [phone] Fax: [phone] Hours: 24/7

## 2013-08-05 NOTE — ED Notes (Signed)
Pt states has 2 court dates at end of the month,

## 2014-05-26 DEATH — deceased
